# Patient Record
Sex: Male | Born: 1975 | Race: White | Hispanic: No | Marital: Married | Smoking: Current every day smoker
Health system: Southern US, Community
[De-identification: ages and names within clinical notes are randomized; demographics above are authoritative.]

## PROBLEM LIST (undated history)

## (undated) DIAGNOSIS — F32A Depression, unspecified: Secondary | ICD-10-CM

## (undated) DIAGNOSIS — F329 Major depressive disorder, single episode, unspecified: Secondary | ICD-10-CM

---

## 2003-10-29 ENCOUNTER — Inpatient Hospital Stay (HOSPITAL_COMMUNITY): Admission: AD | Admit: 2003-10-29 | Discharge: 2003-10-30 | Payer: Self-pay | Admitting: Psychiatry

## 2009-06-19 ENCOUNTER — Emergency Department (HOSPITAL_COMMUNITY): Admission: EM | Admit: 2009-06-19 | Discharge: 2009-06-19 | Payer: Self-pay | Admitting: Emergency Medicine

## 2010-04-06 ENCOUNTER — Emergency Department (HOSPITAL_COMMUNITY): Admission: EM | Admit: 2010-04-06 | Discharge: 2010-04-06 | Payer: Self-pay | Admitting: Emergency Medicine

## 2010-06-11 ENCOUNTER — Emergency Department (HOSPITAL_COMMUNITY)
Admission: EM | Admit: 2010-06-11 | Discharge: 2010-06-12 | Payer: Self-pay | Source: Home / Self Care | Admitting: Emergency Medicine

## 2010-09-13 LAB — DIFFERENTIAL
Basophils Absolute: 0.1 K/uL (ref 0.0–0.1)
Basophils Relative: 0 % (ref 0–1)
Eosinophils Absolute: 0.2 K/uL (ref 0.0–0.7)
Eosinophils Relative: 1 % (ref 0–5)
Lymphocytes Relative: 30 % (ref 12–46)
Lymphs Abs: 3.8 10*3/uL (ref 0.7–4.0)
Monocytes Absolute: 0.9 10*3/uL (ref 0.1–1.0)
Monocytes Relative: 8 % (ref 3–12)
Neutro Abs: 7.5 K/uL (ref 1.7–7.7)
Neutrophils Relative %: 60 % (ref 43–77)

## 2010-09-13 LAB — CBC
HCT: 48.6 % (ref 39.0–52.0)
Hemoglobin: 17.2 g/dL — ABNORMAL HIGH (ref 13.0–17.0)
MCH: 33.6 pg (ref 26.0–34.0)
MCHC: 35.4 g/dL (ref 30.0–36.0)
MCV: 94.9 fL (ref 78.0–100.0)
Platelets: 232 10*3/uL (ref 150–400)
RBC: 5.12 MIL/uL (ref 4.22–5.81)
RDW: 12.5 % (ref 11.5–15.5)
WBC: 12.5 10*3/uL — ABNORMAL HIGH (ref 4.0–10.5)

## 2010-09-13 LAB — BASIC METABOLIC PANEL WITH GFR
BUN: 6 mg/dL (ref 6–23)
CO2: 24 meq/L (ref 19–32)
Calcium: 9.4 mg/dL (ref 8.4–10.5)
Creatinine, Ser: 0.98 mg/dL (ref 0.4–1.5)
GFR calc non Af Amer: >60 mL/min (ref >60)
Glucose, Bld: 90 mg/dL (ref 70–99)

## 2010-09-13 LAB — RAPID URINE DRUG SCREEN, HOSP PERFORMED
Amphetamines: NOT DETECTED
Barbiturates: NOT DETECTED
Benzodiazepines: NOT DETECTED
Cocaine: NOT DETECTED
Opiates: NOT DETECTED
Tetrahydrocannabinol: POSITIVE — AB

## 2010-09-13 LAB — BASIC METABOLIC PANEL
Chloride: 104 mEq/L (ref 96–112)
GFR calc Af Amer: >60 mL/min (ref >60)
Potassium: 4.1 mEq/L (ref 3.5–5.1)
Sodium: 141 mEq/L (ref 135–145)

## 2010-09-13 LAB — ETHANOL: Alcohol, Ethyl (B): 129 mg/dL — ABNORMAL HIGH (ref 0–10)

## 2010-11-18 NOTE — Discharge Summary (Signed)
[name], [name] NO.:  [id number]   MEDICAL RECORD NO.:  [id number]                   PATIENT TYPE:  IPS   LOCATION:  0506                                 FACILITY[facility]  [facility]   PHYSICIAN:  Geoffery Lyons, M.D.                   DATE OF BIRTH:  11-26-1975   DATE OF ADMISSION:  10/29/2003  DATE OF DISCHARGE:  10/30/2003                                 DISCHARGE SUMMARY   CHIEF COMPLAINT AND PRESENT ILLNESS:  This was the first admission to [facility] for this 35 year old single white male  involuntarily committed.  He had history of intentional overdose.  He  overdosed on a [employer] sleeping aid, taking 24 pills.  He wrote a note to  his mother.  Intention was to sleep and not wake up.  He called his friends  and family to say goodbye.  He was transported to the emergency department.  He had an attempt to leave the emergency room against medical advice.  He  reported that he was experiencing stressors that he would rather not talk  about.   PAST PSYCHIATRIC HISTORY:  This was the first time at [facility].  No other psychiatric treatment.   SUBSTANCE ABUSE HISTORY:  He drank six to seven beers the day of the  overdose but denied any alcohol problems.   PAST MEDICAL HISTORY:  Noncontributory.   MEDICATIONS:  None.   PHYSICAL EXAMINATION:  Physical examination was performed, failed to show  any acute findings.  Blood pressure 136/33.   LABORATORY DATA:  Alcohol level was 157.  Potassium 3.4  Urine drug screen  was negative.  CBC: White blood cells 11.9.   MENTAL STATUS EXAM:  Mental status exam revealed a cooperative male, in bed,  sleepy, fair eye contact.  Speech was clear.  Sleepy, trying to cooperate.  Thought processes: Answers were coherent.  He did not appear to be  psychotic.  Cognitive: Cognition was well preserved.   ADMISSION DIAGNOSES:   AXIS I:  1. Major depression.  2. Alcohol abuse.   AXIS  II:  No diagnosis.   AXIS III:  No diagnosis.   AXIS IV:  Moderate.   AXIS V:  Global assessment of functioning upon admission 25, highest global  assessment of functioning in the last year 65-70.   HOSPITAL COURSE:  He was admitted and started in intensive individual and  group psychotherapy.  He was given Ambien for sleep, Librium on an as needed  basis.  He was started on Lexapro 5 mg.  By April 29, he was endorsing no  suicidal ideas.  He said that what he did was stupid, that he would not do  it again.  He was agreeable to taking antidepressants.  He said that he did  not need to be there.  He wanted to be discharged.  He was in  full contact  with reality.  He endorsed no suicidal ideas, no homicidal ideas, no  hallucinations, no delusions.  He endorsed that he was under increased  stress pressure from the girlfriend when she had decided to leave, he lost  his job, had financial difficulties.  He would be willing to go to  outpatient treatment.  He did not have a license due to his second DWI.  He  endorsed support from the mother.  He said that he had lined up a possible  job, would check with a Clinical research associate for bankruptcy.  He planned to get his life  back together.  We contacted the mother who was agreeable with him being  discharged.  She had no concerns so we went ahead and discharged to  outpatient followup.   DISCHARGE DIAGNOSES:   AXIS I:  1. Alcohol abuse.  2. Major depression.   AXIS II:  No diagnosis.   AXIS III:  No diagnosis.   AXIS IV:  Moderate.   AXIS V:  Global assessment of functioning upon discharge 50.   DISCHARGE MEDICATIONS:  Lexapro 10 mg per day.   FOLLOW UP:  He was to follow up at [facility].                                               Geoffery Lyons, M.D.    IL/MEDQ  D:  11/25/2003  T:  11/27/2003  Job:  [id number]

## 2010-11-18 NOTE — H&P (Signed)
[name], GUYMON NO.:  [id number]   MEDICAL RECORD NO.:  [id number]                   PATIENT TYPE:  IPS   LOCATION:  0506                                 FACILITY:  [facility]   PHYSICIAN:  Geoffery Lyons, M.D.                   DATE OF BIRTH:  [birth year]   DATE OF ADMISSION:  10/29/2003  DATE OF DISCHARGE:  10/30/2003                         PSYCHIATRIC ADMISSION ASSESSMENT   IDENTIFYING INFORMATION:  [name] was  involuntarily committed on October 29, 2003.   HISTORY OF PRESENT ILLNESS:  The patient presents with a history of  intentional overdose.  The patient overdosed on [employer] sleeping aid,  taking approximately 24 pills.  The patient states he wrote a note to his  mother.  He states his intention with the overdose was to sleep and not wake  up.  The patient states he called his friends and family to say goodbye.  The patient was transported to the emergency department.  The patient also  had attempted to leave the ER against medical advice.  The patient reports  that he is experiencing stressors that he would rather not talk about.   PAST PSYCHIATRIC HISTORY:  First hospitalization to [facility].  No other psychiatric admissions.  No apparent history of being  detoxed before from alcohol.   SOCIAL HISTORY:  This is a 35 year old single white male.  He lives with his  mom.  He was recently fired from his job on Friday because it was a  deliberate work slowdown.  The patient states that he wanted a raise, which  he did not get.  Denies any legal problems.   FAMILY HISTORY:  Unclear.   ALCOHOL/DRUG HISTORY:  The patient states he drank 6-7 beers on the day of  his overdose.  Denies any alcohol problem.  No apparent drug use.   PRIMARY CARE PHYSICIAN:  None.   MEDICAL PROBLEMS:  None.   MEDICATIONS:  None.   ALLERGIES:  No known allergies.   PHYSICAL EXAMINATION:  The patient was assessed at the  [facility].  The patient was charcoaled.   LABORATORY DATA:  His alcohol level on admission was 157 down to 107 prior  to transfer.  Potassium is 3.4.  Urine drug screen was negative.  WBC count  was 11.9.  His blood pressure was 136/83  She appears in no acute distress.   MENTAL STATUS EXAM:  He is in bed, sleepy, cooperative.  Fair eye contact.  Speech is clear.  The patient feels sleepy.  He appears as such.  He is  trying to be cooperative as possible.  Thought process answers are coherent.  He does not appear to be psychotic at this time.  Cognitive function intact.  Memory is fair.  Judgment is fair.  Insight is somewhat limited.   DIAGNOSES:   AXIS I:  1. Major depressive  disorder with intentional overdose, single episode.  2. Rule out alcohol abuse.   AXIS II:  Deferred.   AXIS III:  1. Problems with occupation.  2. Other psychosocial problems.   AXIS IV:  Current 25; past year estimated at 65.   PLAN:  Involuntary commitment for intentional overdose.  Contract for  safety.  Stabilize mood and thinking.  The patient is to increase his coping  skills.  Will have Librium available for withdrawal symptoms.  Initiate an  antidepressant.  Will have a family session with support group.     [name], N.P.                       Geoffery Lyons, M.D.    JO/MEDQ  D:  10/30/2003  T:  11/01/2003  Job:  [id number]

## 2012-04-08 ENCOUNTER — Emergency Department (HOSPITAL_COMMUNITY)
Admission: EM | Admit: 2012-04-08 | Discharge: 2012-04-10 | Disposition: A | Discharge status: Home / Self Care | Payer: Self-pay | Attending: Emergency Medicine | Admitting: Emergency Medicine

## 2012-04-08 DIAGNOSIS — Z046 Encounter for general psychiatric examination, requested by authority: Secondary | ICD-10-CM | POA: Insufficient documentation

## 2012-04-08 DIAGNOSIS — T50904A Poisoning by unspecified drugs, medicaments and biological substances, undetermined, initial encounter: Secondary | ICD-10-CM | POA: Insufficient documentation

## 2012-04-08 DIAGNOSIS — T50901A Poisoning by unspecified drugs, medicaments and biological substances, accidental (unintentional), initial encounter: Secondary | ICD-10-CM | POA: Insufficient documentation

## 2012-04-08 HISTORY — DX: Major depressive disorder, single episode, unspecified: F32.9

## 2012-04-08 HISTORY — DX: Depression, unspecified: F32.A

## 2012-04-08 NOTE — ED Notes (Signed)
MVH:QION<GE> Expected date:04/08/12<BR> Expected time:11:43 PM<BR> Means of arrival:Ambulance<BR> Comments:<BR> ETOH, OTC sleeping medication

## 2012-04-08 NOTE — ED Notes (Signed)
[employer] taking out IVC papers on pt at this time

## 2012-04-08 NOTE — ED Notes (Signed)
Per EMS pt has been drinking beer tonight in large amts and has taken some OTC sleeping pills  Pt's family called EMS after he stated he had taken the pills  Pt states he took the pills about 2 hrs ago  No pill boxes or bottles found at the residence

## 2012-04-09 LAB — CBC
HCT: 46.1 % (ref 39.0–52.0)
MCV: 94.3 fL (ref 78.0–100.0)
RDW: 12.6 % (ref 11.5–15.5)
WBC: 11.4 10*3/uL — ABNORMAL HIGH (ref 4.0–10.5)

## 2012-04-09 LAB — COMPREHENSIVE METABOLIC PANEL
Albumin: 4.4 g/dL (ref 3.5–5.2)
BUN: 8 mg/dL (ref 6–23)
CO2: 22 mEq/L (ref 19–32)
Chloride: 96 mEq/L (ref 96–112)
Creatinine, Ser: 0.9 mg/dL (ref 0.50–1.35)
GFR calc non Af Amer: >90 mL/min (ref >90)
Total Bilirubin: 0.6 mg/dL (ref 0.3–1.2)

## 2012-04-09 LAB — ETHANOL: Alcohol, Ethyl (B): 231 mg/dL — ABNORMAL HIGH (ref 0–11)

## 2012-04-09 LAB — RAPID URINE DRUG SCREEN, HOSP PERFORMED: Barbiturates: NOT DETECTED

## 2012-04-09 LAB — ACETAMINOPHEN LEVEL: Acetaminophen (Tylenol), Serum: <15 ug/mL (ref 10–30)

## 2012-04-09 MED ORDER — IBUPROFEN 200 MG PO TABS: 600.0000 mg | ORAL_TABLET | Freq: Three times a day (TID) | ORAL | Status: DC | PRN

## 2012-04-09 MED ORDER — ONDANSETRON HCL 4 MG PO TABS: 4.0000 mg | ORAL_TABLET | Freq: Three times a day (TID) | ORAL | Status: DC | PRN

## 2012-04-09 MED ORDER — ALUM & MAG HYDROXIDE-SIMETH 200-200-20 MG/5ML PO SUSP: 30.0000 mL | ORAL | Status: DC | PRN

## 2012-04-09 MED ORDER — NICOTINE 21 MG/24HR TD PT24
21.0000 mg | MEDICATED_PATCH | Freq: Every day | TRANSDERMAL | Status: DC
  Filled 2012-04-09: qty 1

## 2012-04-09 MED ORDER — ACETAMINOPHEN 325 MG PO TABS: 650.0000 mg | ORAL_TABLET | ORAL | Status: DC | PRN

## 2012-04-09 MED ORDER — LORATADINE 10 MG PO TABS
10.0000 mg | ORAL_TABLET | Freq: Every day | ORAL | Status: DC
  Administered 2012-04-09: 10 mg via ORAL
  Filled 2012-04-09 (×2): qty 1

## 2012-04-09 NOTE — ED Notes (Signed)
Pt is in handcuff by the [employer] because pt attempted to leave the facility. GDP sts they have to stay with this patient until pt transfer to [facility] ED. Pt only has his right hand handcuff, CMS intact, neurologically intact, no skin breakdown noted.

## 2012-04-09 NOTE — ED Notes (Signed)
Sister: Rinaldo Cloud [phone]

## 2012-04-09 NOTE — ED Notes (Signed)
Report given to [facility] Nurse Toniann Fail. Will transport pt to psy ED

## 2012-04-09 NOTE — ED Notes (Signed)
[id number]<HK> Expected date:<BR> Expected time:<BR> Means of arrival:<BR> Comments:<BR> Hold for room 20

## 2012-04-09 NOTE — ED Notes (Signed)
Pt slamming his arms into side rails and cursing ED staff. Pt asked to stop this behavior. Pt sts "I just want out of here." Pt informed his behavior is not the way to get out of the ED faster.

## 2012-04-09 NOTE — ED Notes (Signed)
Pt leaving out of EMS bay doors- stating that I don't need to be here. Security called to get patient.

## 2012-04-09 NOTE — ED Notes (Signed)
Pt admitted to psych ED. S/P overdose on OTC sleeping pills. VSS. Denies SI/HI presently. Denies sx of psychosis and no evidence. Affect flat. Mood sad. States drinks ETOH daily 12-pack beer. No withdrawal sx noted. Denies other substance abuse except occasional THC.

## 2012-04-09 NOTE — ED Notes (Signed)
Pt returned to [facility] A by [employer] with IVC paperwork

## 2012-04-09 NOTE — ED Notes (Signed)
1 bag of belonging bag placed in TCU locker 36
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
## 2012-04-09 NOTE — ED Notes (Signed)
wanded by security 

## 2012-04-09 NOTE — ED Notes (Signed)
Pt sts he has drank 1 case of beer tonight along with 20 sleeping pills. sts he was not trying to harm himself, that he was trying to sleep peacefully. sts "I'm dying next year anyway from liver disease! What do you care?"

## 2012-04-09 NOTE — ED Notes (Signed)
Security called, pt taken off hand cuff. Pt placed in blue scrubs. Foley and IV removed

## 2012-04-09 NOTE — ED Notes (Signed)
Pt is medically clear to go to [facility] ED.

## 2012-04-10 ENCOUNTER — Encounter (HOSPITAL_COMMUNITY): Payer: Self-pay | Admitting: *Deleted

## 2012-04-10 NOTE — ED Notes (Signed)
Tele pysch in progress 

## 2012-04-10 NOTE — ED Notes (Signed)
Patient discharge with steady gait. Respirations equal and unlabored. Skin warm and dry. No acute distress noted. 
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
## 2012-04-10 NOTE — ED Provider Notes (Signed)
Telepsych saw patient, appreciate recs. Patient cleared by psych. Not suicidal or homicidal currently. Recommend outpatient follow up.   Richardean Canal, MD 04/10/12 [phone]

## 2012-04-11 LAB — GLUCOSE, CAPILLARY: Glucose-Capillary: 93 mg/dL (ref 70–99)

## 2015-07-21 ENCOUNTER — Emergency Department (HOSPITAL_COMMUNITY)
Admission: EM | Admit: 2015-07-21 | Discharge: 2015-07-22 | Disposition: A | Discharge status: Home / Self Care | Payer: BLUE CROSS/BLUE SHIELD | Attending: Emergency Medicine | Admitting: Emergency Medicine

## 2015-07-21 ENCOUNTER — Emergency Department (HOSPITAL_COMMUNITY): Payer: BLUE CROSS/BLUE SHIELD

## 2015-07-21 DIAGNOSIS — Y9389 Activity, other specified: Secondary | ICD-10-CM | POA: Diagnosis not present

## 2015-07-21 DIAGNOSIS — Y92481 Parking lot as the place of occurrence of the external cause: Secondary | ICD-10-CM | POA: Insufficient documentation

## 2015-07-21 DIAGNOSIS — R000 Tachycardia, unspecified: Secondary | ICD-10-CM | POA: Diagnosis not present

## 2015-07-21 DIAGNOSIS — W01198A Fall on same level from slipping, tripping and stumbling with subsequent striking against other object, initial encounter: Secondary | ICD-10-CM | POA: Diagnosis not present

## 2015-07-21 DIAGNOSIS — R4182 Altered mental status, unspecified: Secondary | ICD-10-CM

## 2015-07-21 DIAGNOSIS — F1020 Alcohol dependence, uncomplicated: Secondary | ICD-10-CM | POA: Diagnosis not present

## 2015-07-21 DIAGNOSIS — Y998 Other external cause status: Secondary | ICD-10-CM | POA: Diagnosis not present

## 2015-07-21 DIAGNOSIS — S0081XA Abrasion of other part of head, initial encounter: Secondary | ICD-10-CM | POA: Insufficient documentation

## 2015-07-21 DIAGNOSIS — R451 Restlessness and agitation: Secondary | ICD-10-CM | POA: Diagnosis not present

## 2015-07-21 DIAGNOSIS — F1910 Other psychoactive substance abuse, uncomplicated: Secondary | ICD-10-CM | POA: Diagnosis not present

## 2015-07-21 DIAGNOSIS — F10921 Alcohol use, unspecified with intoxication delirium: Secondary | ICD-10-CM

## 2015-07-21 DIAGNOSIS — Z79899 Other long term (current) drug therapy: Secondary | ICD-10-CM | POA: Diagnosis not present

## 2015-07-21 DIAGNOSIS — F10121 Alcohol abuse with intoxication delirium: Secondary | ICD-10-CM | POA: Insufficient documentation

## 2015-07-21 DIAGNOSIS — F332 Major depressive disorder, recurrent severe without psychotic features: Secondary | ICD-10-CM | POA: Diagnosis not present

## 2015-07-21 DIAGNOSIS — Z046 Encounter for general psychiatric examination, requested by authority: Secondary | ICD-10-CM | POA: Diagnosis present

## 2015-07-21 LAB — CBC WITH DIFFERENTIAL/PLATELET
BASOS ABS: 0.1 10*3/uL (ref 0.0–0.1)
Basophils Relative: 1 %
Eosinophils Absolute: 0.2 10*3/uL (ref 0.0–0.7)
Eosinophils Relative: 2 %
HEMATOCRIT: 44.5 % (ref 39.0–52.0)
Hemoglobin: 15.5 g/dL (ref 13.0–17.0)
LYMPHS ABS: 3.4 10*3/uL (ref 0.7–4.0)
Lymphocytes Relative: 38 %
MCH: 32.7 pg (ref 26.0–34.0)
MCHC: 34.8 g/dL (ref 30.0–36.0)
MCV: 93.9 fL (ref 78.0–100.0)
MONO ABS: 0.7 10*3/uL (ref 0.1–1.0)
MONOS PCT: 8 %
Neutro Abs: 4.5 10*3/uL (ref 1.7–7.7)
Neutrophils Relative %: 51 %
PLATELETS: 230 10*3/uL (ref 150–400)
RBC: 4.74 MIL/uL (ref 4.22–5.81)
RDW: 11.8 % (ref 11.5–15.5)
WBC: 8.9 10*3/uL (ref 4.0–10.5)

## 2015-07-21 LAB — I-STAT CG4 LACTIC ACID, ED
LACTIC ACID, VENOUS: 4.55 mmol/L — AB (ref 0.5–2.0)
Lactic Acid, Venous: 2.04 mmol/L (ref 0.5–2.0)

## 2015-07-21 LAB — COMPREHENSIVE METABOLIC PANEL
ALT: 59 U/L (ref 17–63)
AST: 43 U/L — ABNORMAL HIGH (ref 15–41)
Albumin: 4.4 g/dL (ref 3.5–5.0)
Alkaline Phosphatase: 88 U/L (ref 38–126)
Anion gap: 15 (ref 5–15)
BUN: 13 mg/dL (ref 6–20)
CHLORIDE: 102 mmol/L (ref 101–111)
CO2: 19 mmol/L — ABNORMAL LOW (ref 22–32)
CREATININE: 1 mg/dL (ref 0.61–1.24)
Calcium: 8.9 mg/dL (ref 8.9–10.3)
Glucose, Bld: 97 mg/dL (ref 65–99)
Potassium: 3.8 mmol/L (ref 3.5–5.1)
Sodium: 136 mmol/L (ref 135–145)
Total Bilirubin: 0.4 mg/dL (ref 0.3–1.2)
Total Protein: 7.2 g/dL (ref 6.5–8.1)

## 2015-07-21 LAB — ETHANOL: ALCOHOL ETHYL (B): 223 mg/dL — AB (ref <5)

## 2015-07-21 LAB — SALICYLATE LEVEL

## 2015-07-21 LAB — I-STAT CHEM 8, ED
BUN: 13 mg/dL (ref 6–20)
CALCIUM ION: 1.03 mmol/L — AB (ref 1.12–1.23)
CREATININE: 1.1 mg/dL (ref 0.61–1.24)
Chloride: 102 mmol/L (ref 101–111)
GLUCOSE: 94 mg/dL (ref 65–99)
HCT: 49 % (ref 39.0–52.0)
HEMOGLOBIN: 16.7 g/dL (ref 13.0–17.0)
Potassium: 3.7 mmol/L (ref 3.5–5.1)
Sodium: 136 mmol/L (ref 135–145)
TCO2: 18 mmol/L (ref 0–100)

## 2015-07-21 LAB — CBG MONITORING, ED: Glucose-Capillary: 97 mg/dL (ref 65–99)

## 2015-07-21 LAB — TROPONIN I

## 2015-07-21 LAB — ACETAMINOPHEN LEVEL

## 2015-07-21 MED ORDER — SODIUM CHLORIDE 0.9 % IV BOLUS (SEPSIS)
1000.0000 mL | Freq: Once | INTRAVENOUS | Status: DC
Start: 2015-07-21 — End: 2015-07-22

## 2015-07-21 MED ORDER — ZIPRASIDONE MESYLATE 20 MG IM SOLR
20.0000 mg | Freq: Once | INTRAMUSCULAR | Status: AC
  Administered 2015-07-21: 20 mg via INTRAMUSCULAR

## 2015-07-21 MED ORDER — SODIUM CHLORIDE 0.9 % IV BOLUS (SEPSIS)
1000.0000 mL | Freq: Once | INTRAVENOUS | Status: AC
  Administered 2015-07-21: 1000 mL via INTRAVENOUS

## 2015-07-21 MED ORDER — STERILE WATER FOR INJECTION IJ SOLN
INTRAMUSCULAR | Status: AC
  Administered 2015-07-21: 10 mL
  Filled 2015-07-21: qty 10

## 2015-07-21 MED ORDER — ZIPRASIDONE MESYLATE 20 MG IM SOLR
INTRAMUSCULAR | Status: AC
  Filled 2015-07-21: qty 20

## 2015-07-21 MED ORDER — SODIUM CHLORIDE 0.9 % IV BOLUS (SEPSIS)
500.0000 mL | Freq: Once | INTRAVENOUS | Status: AC
  Administered 2015-07-21: 500 mL via INTRAVENOUS

## 2015-07-21 NOTE — Progress Notes (Signed)
EDCM went to speak to patient at bedside, however, patient was asleep. Patient listed as not having a pcp or insurance living in [facility].  [facility] provided patient with contact information to [facility], informed patient of services there.  EDCM also provided patient with list of pcps who accept self pay patients, list of discount pharmacies and websites needymeds.org and GoodRX.com for medication assistance, phone number to inquire about the orange card, phone number to inquire about Medicaid, phone number to inquire about the Affordable Care Act, financial resources in the community such as local churches, salvation [employer], urban ministries, and dental assistance for uninsured patients.  . This information was placed on bedside table as patient was sleeping.  EDCM did not wake patient at this time.   No further EDCM needs at this time.
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
## 2015-07-21 NOTE — ED Notes (Signed)
Communication with EDP to d/c hard restraints. Pt is resting calmly

## 2015-07-21 NOTE — ED Notes (Signed)
Unable to collect labs at this time because of behavior.

## 2015-07-21 NOTE — ED Notes (Signed)
Abnormal lab result given to Dr Rubin Payor
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
## 2015-07-21 NOTE — ED Notes (Signed)
Pt has urinated on himself. Clothing is wet.

## 2015-07-22 ENCOUNTER — Encounter (HOSPITAL_COMMUNITY): Payer: Self-pay | Admitting: *Deleted

## 2015-07-22 ENCOUNTER — Emergency Department (EMERGENCY_DEPARTMENT_HOSPITAL)
Admission: EM | Admit: 2015-07-22 | Discharge: 2015-07-23 | Disposition: A | Discharge status: Other Acute Inpatient Hospital | Payer: BLUE CROSS/BLUE SHIELD | Source: Home / Self Care | Attending: Emergency Medicine | Admitting: Emergency Medicine

## 2015-07-22 ENCOUNTER — Emergency Department (HOSPITAL_COMMUNITY): Payer: BLUE CROSS/BLUE SHIELD

## 2015-07-22 DIAGNOSIS — R000 Tachycardia, unspecified: Secondary | ICD-10-CM | POA: Insufficient documentation

## 2015-07-22 DIAGNOSIS — F10129 Alcohol abuse with intoxication, unspecified: Secondary | ICD-10-CM | POA: Insufficient documentation

## 2015-07-22 DIAGNOSIS — S0003XA Contusion of scalp, initial encounter: Secondary | ICD-10-CM

## 2015-07-22 DIAGNOSIS — Y9289 Other specified places as the place of occurrence of the external cause: Secondary | ICD-10-CM

## 2015-07-22 DIAGNOSIS — F1020 Alcohol dependence, uncomplicated: Secondary | ICD-10-CM | POA: Diagnosis present

## 2015-07-22 DIAGNOSIS — Y998 Other external cause status: Secondary | ICD-10-CM | POA: Insufficient documentation

## 2015-07-22 DIAGNOSIS — R4182 Altered mental status, unspecified: Secondary | ICD-10-CM

## 2015-07-22 DIAGNOSIS — F1910 Other psychoactive substance abuse, uncomplicated: Secondary | ICD-10-CM

## 2015-07-22 DIAGNOSIS — W01198A Fall on same level from slipping, tripping and stumbling with subsequent striking against other object, initial encounter: Secondary | ICD-10-CM

## 2015-07-22 DIAGNOSIS — F329 Major depressive disorder, single episode, unspecified: Secondary | ICD-10-CM

## 2015-07-22 DIAGNOSIS — F332 Major depressive disorder, recurrent severe without psychotic features: Secondary | ICD-10-CM | POA: Diagnosis present

## 2015-07-22 DIAGNOSIS — F1310 Sedative, hypnotic or anxiolytic abuse, uncomplicated: Secondary | ICD-10-CM | POA: Insufficient documentation

## 2015-07-22 DIAGNOSIS — Y9389 Activity, other specified: Secondary | ICD-10-CM

## 2015-07-22 DIAGNOSIS — S0992XA Unspecified injury of nose, initial encounter: Secondary | ICD-10-CM

## 2015-07-22 DIAGNOSIS — R45851 Suicidal ideations: Secondary | ICD-10-CM

## 2015-07-22 LAB — COMPREHENSIVE METABOLIC PANEL
ALBUMIN: 4.5 g/dL (ref 3.5–5.0)
ALT: 60 U/L (ref 17–63)
AST: 45 U/L — AB (ref 15–41)
Alkaline Phosphatase: 94 U/L (ref 38–126)
Anion gap: 12 (ref 5–15)
BILIRUBIN TOTAL: 0.9 mg/dL (ref 0.3–1.2)
BUN: 8 mg/dL (ref 6–20)
CHLORIDE: 101 mmol/L (ref 101–111)
CO2: 19 mmol/L — ABNORMAL LOW (ref 22–32)
CREATININE: 0.76 mg/dL (ref 0.61–1.24)
Calcium: 8.7 mg/dL — ABNORMAL LOW (ref 8.9–10.3)
GFR calc Af Amer: >60 mL/min (ref >60)
GLUCOSE: 89 mg/dL (ref 65–99)
Potassium: 3.9 mmol/L (ref 3.5–5.1)
Sodium: 132 mmol/L — ABNORMAL LOW (ref 135–145)
TOTAL PROTEIN: 7.2 g/dL (ref 6.5–8.1)

## 2015-07-22 LAB — URINALYSIS, ROUTINE W REFLEX MICROSCOPIC
BILIRUBIN URINE: NEGATIVE
GLUCOSE, UA: NEGATIVE mg/dL
HGB URINE DIPSTICK: NEGATIVE
KETONES UR: NEGATIVE mg/dL
Leukocytes, UA: NEGATIVE
Nitrite: NEGATIVE
PROTEIN: NEGATIVE mg/dL
Specific Gravity, Urine: 1.004 — ABNORMAL LOW (ref 1.005–1.030)
pH: 5 (ref 5.0–8.0)

## 2015-07-22 LAB — RAPID URINE DRUG SCREEN, HOSP PERFORMED
Amphetamines: NOT DETECTED
BARBITURATES: NOT DETECTED
BENZODIAZEPINES: NOT DETECTED
COCAINE: NOT DETECTED
Opiates: NOT DETECTED
Tetrahydrocannabinol: NOT DETECTED

## 2015-07-22 LAB — SALICYLATE LEVEL: Salicylate Lvl: <4 mg/dL (ref 2.8–30.0)

## 2015-07-22 LAB — CBC
HEMATOCRIT: 43.9 % (ref 39.0–52.0)
Hemoglobin: 15.6 g/dL (ref 13.0–17.0)
MCH: 33.5 pg (ref 26.0–34.0)
MCHC: 35.5 g/dL (ref 30.0–36.0)
MCV: 94.4 fL (ref 78.0–100.0)
PLATELETS: 243 10*3/uL (ref 150–400)
RBC: 4.65 MIL/uL (ref 4.22–5.81)
RDW: 12 % (ref 11.5–15.5)
WBC: 10.9 10*3/uL — AB (ref 4.0–10.5)

## 2015-07-22 LAB — ETHANOL: ALCOHOL ETHYL (B): 234 mg/dL — AB (ref <5)

## 2015-07-22 LAB — ACETAMINOPHEN LEVEL: Acetaminophen (Tylenol), Serum: <10 ug/mL — ABNORMAL LOW (ref 10–30)

## 2015-07-22 MED ORDER — ONDANSETRON HCL 4 MG PO TABS: 4.0000 mg | ORAL_TABLET | Freq: Three times a day (TID) | ORAL | Status: DC | PRN

## 2015-07-22 MED ORDER — ALUM & MAG HYDROXIDE-SIMETH 200-200-20 MG/5ML PO SUSP: 30.0000 mL | ORAL | Status: DC | PRN

## 2015-07-22 MED ORDER — LORAZEPAM 1 MG PO TABS: 0.0000 mg | ORAL_TABLET | Freq: Four times a day (QID) | ORAL | Status: DC

## 2015-07-22 MED ORDER — LORAZEPAM 1 MG PO TABS: 0.0000 mg | ORAL_TABLET | Freq: Two times a day (BID) | ORAL | Status: DC

## 2015-07-22 MED ORDER — ACETAMINOPHEN 325 MG PO TABS: 650.0000 mg | ORAL_TABLET | ORAL | Status: DC | PRN

## 2015-07-22 MED ORDER — NICOTINE 21 MG/24HR TD PT24
21.0000 mg | MEDICATED_PATCH | Freq: Every day | TRANSDERMAL | Status: DC
Start: 2015-07-22 — End: 2015-07-23
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
## 2015-07-22 NOTE — Discharge Instructions (Signed)
Alcohol Intoxication  Alcohol intoxication occurs when the amount of alcohol that a person has consumed impairs his or her ability to mentally and physically function. Alcohol directly impairs the normal chemical activity of the brain. Drinking large amounts of alcohol can lead to changes in mental function and behavior, and it can cause many physical effects that can be harmful.   Alcohol intoxication can range in severity from mild to very severe. Various factors can affect the level of intoxication that occurs, such as the person's age, gender, weight, frequency of alcohol consumption, and the presence of other medical conditions (such as diabetes, seizures, or heart conditions). Dangerous levels of alcohol intoxication may occur when people drink large amounts of alcohol in a short period (binge drinking). Alcohol can also be especially dangerous when combined with certain prescription medicines or "recreational" drugs.  SIGNS AND SYMPTOMS  Some common signs and symptoms of mild alcohol intoxication include:  · Loss of coordination.  · Changes in mood and behavior.  · Impaired judgment.  · Slurred speech.  As alcohol intoxication progresses to more severe levels, other signs and symptoms will appear. These may include:  · Vomiting.  · Confusion and impaired memory.  · Slowed breathing.  · Seizures.  · Loss of consciousness.  DIAGNOSIS   Your health care provider will take a medical history and perform a physical exam. You will be asked about the amount and type of alcohol you have consumed. Blood tests will be done to measure the concentration of alcohol in your blood. In many places, your blood alcohol level must be lower than 80 mg/dL (0.08%) to legally drive. However, many dangerous effects of alcohol can occur at much lower levels.   TREATMENT   People with alcohol intoxication often do not require treatment. Most of the effects of alcohol intoxication are temporary, and they go away as the alcohol naturally  leaves the body. Your health care provider will monitor your condition until you are stable enough to go home. Fluids are sometimes given through an IV access tube to help prevent dehydration.   HOME CARE INSTRUCTIONS  · Do not drive after drinking alcohol.  · Stay hydrated. Drink enough water and fluids to keep your urine clear or pale yellow. Avoid caffeine.    · Only take over-the-counter or prescription medicines as directed by your health care provider.    SEEK MEDICAL CARE IF:   · You have persistent vomiting.    · You do not feel better after a few days.  · You have frequent alcohol intoxication. Your health care provider can help determine if you should see a substance use treatment counselor.  SEEK IMMEDIATE MEDICAL CARE IF:   · You become shaky or tremble when you try to stop drinking.    · You shake uncontrollably (seizure).    · You throw up (vomit) blood. This may be bright red or may look like black coffee grounds.    · You have blood in your stool. This may be bright red or may appear as a black, tarry, bad smelling stool.    · You become lightheaded or faint.    MAKE SURE YOU:   · Understand these instructions.  · Will watch your condition.  · Will get help right away if you are not doing well or get worse.     This information is not intended to replace advice given to you by your health care provider. Make sure you discuss any questions you have with your health care provider.       Document Released: 03/29/2005 Document Revised: 02/19/2013 Document Reviewed: 11/22/2012  Elsevier Interactive Patient Education ©2016 [employer]

## 2015-07-22 NOTE — BH Assessment (Signed)
Assessment completed. Consulted Hulan Fess, NP who recommended that pt be evaluated by psychiatry in the morning. Informed Dr. Hyacinth Meeker of the recommendation.

## 2015-07-22 NOTE — BH Assessment (Signed)
Attempted to gather additional information due to petitioner being unavailable at this time. Will attempt contact at a later time.
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
## 2015-07-22 NOTE — ED Notes (Signed)
Pt ambulated to station asking for bathroom. Asking what happened, im ready to go home. Refuses to change pants.
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
## 2015-07-23 ENCOUNTER — Inpatient Hospital Stay (HOSPITAL_COMMUNITY)
Admission: AD | Admit: 2015-07-23 | Discharge: 2015-07-27 | DRG: 897 | Disposition: A | Discharge status: Home / Self Care | Payer: BLUE CROSS/BLUE SHIELD | Source: Intra-hospital | Attending: Psychiatry | Admitting: Psychiatry

## 2015-07-23 ENCOUNTER — Encounter (HOSPITAL_COMMUNITY): Payer: Self-pay | Admitting: *Deleted

## 2015-07-23 DIAGNOSIS — F17210 Nicotine dependence, cigarettes, uncomplicated: Secondary | ICD-10-CM | POA: Diagnosis present

## 2015-07-23 DIAGNOSIS — F332 Major depressive disorder, recurrent severe without psychotic features: Secondary | ICD-10-CM | POA: Diagnosis not present

## 2015-07-23 DIAGNOSIS — G4700 Insomnia, unspecified: Secondary | ICD-10-CM | POA: Diagnosis present

## 2015-07-23 DIAGNOSIS — F1024 Alcohol dependence with alcohol-induced mood disorder: Secondary | ICD-10-CM | POA: Diagnosis present

## 2015-07-23 DIAGNOSIS — F1910 Other psychoactive substance abuse, uncomplicated: Secondary | ICD-10-CM | POA: Diagnosis not present

## 2015-07-23 DIAGNOSIS — F411 Generalized anxiety disorder: Secondary | ICD-10-CM | POA: Diagnosis present

## 2015-07-23 DIAGNOSIS — F10121 Alcohol abuse with intoxication delirium: Secondary | ICD-10-CM | POA: Diagnosis not present

## 2015-07-23 DIAGNOSIS — F1020 Alcohol dependence, uncomplicated: Secondary | ICD-10-CM | POA: Diagnosis present

## 2015-07-23 DIAGNOSIS — R45851 Suicidal ideations: Secondary | ICD-10-CM | POA: Insufficient documentation

## 2015-07-23 LAB — RAPID URINE DRUG SCREEN, HOSP PERFORMED
Amphetamines: NOT DETECTED
BARBITURATES: NOT DETECTED
BENZODIAZEPINES: POSITIVE — AB
COCAINE: NOT DETECTED
OPIATES: NOT DETECTED
TETRAHYDROCANNABINOL: NOT DETECTED

## 2015-07-23 MED ORDER — FLUOXETINE HCL 10 MG PO CAPS
10.0000 mg | ORAL_CAPSULE | Freq: Every day | ORAL | Status: DC
Start: 2015-07-23 — End: 2015-07-23
  Administered 2015-07-23: 10 mg via ORAL
  Filled 2015-07-23: qty 1

## 2015-07-23 MED ORDER — MAGNESIUM HYDROXIDE 400 MG/5ML PO SUSP: 30.0000 mL | Freq: Every day | ORAL | Status: DC | PRN

## 2015-07-23 MED ORDER — TRAZODONE HCL 100 MG PO TABS: 100.0000 mg | ORAL_TABLET | Freq: Every day | ORAL | Status: DC

## 2015-07-23 MED ORDER — TRAZODONE HCL 50 MG PO TABS: 50.0000 mg | ORAL_TABLET | Freq: Every evening | ORAL | Status: DC | PRN

## 2015-07-23 MED ORDER — ALUM & MAG HYDROXIDE-SIMETH 200-200-20 MG/5ML PO SUSP: 30.0000 mL | ORAL | Status: DC | PRN

## 2015-07-23 MED ORDER — HYDROXYZINE HCL 25 MG PO TABS: 25.0000 mg | ORAL_TABLET | Freq: Three times a day (TID) | ORAL | Status: DC | PRN

## 2015-07-23 MED ORDER — ACETAMINOPHEN 325 MG PO TABS: 650.0000 mg | ORAL_TABLET | Freq: Four times a day (QID) | ORAL | Status: DC | PRN

## 2015-07-23 NOTE — ED Notes (Signed)
[facility] contacted for transport to [facility]. 

## 2015-07-23 NOTE — Progress Notes (Addendum)
Pt confirmed with ED CM he has only been seen x 1 at [facility] "on [address]" when CM inquired if it was in [place] pt said "no"  ED CM unable to find a [facility] on [address]  Pt inquired if this would help him "get out of here quicker" CM replied generally no but referred him to his [facility] providers for d/c plans   [facility] ED CM spoke with pt on how to obtain an in network pcp with insurance coverage via the customer service number or web site  Cm reviewed ED level of care for crisis/emergent services and community pcp level of care to manage continuous or chronic medical concerns.  The pt voiced understanding CM encouraged pt and discussed pt's responsibility to verify with pt's insurance carrier that any recommended medical provider offered by any emergency room or a hospital provider is within the carrier's network. The pt voiced understanding    Entered in d/c instructions If you are unable to return to [facility] [address] doctor Please go to [email or web address]/, locate find a doctor area to use to find in network primary care provider and specialists  Please verify any provider recommended to you is in network Schedule an appointment as soon as possible for a visit As needed

## 2015-07-23 NOTE — BH Assessment (Signed)
[facility] Assessment Progress Note  Per Javier Mins, MD, this pt requires psychiatric hospitalization at this time. Rosey Bath, RN, [facility] has pre-assigned pt to [facility] Rm 307-1, however the bed is not yet vacant. She will call when ready. Pt is under IVC, and IVC documents have been faxed to [facility].  Please note that petition identifies pt as "[name]."  The Examination and Recommendation identifies pt by both names.  Jerolyn Shin at [facility] has been informed to watch for this. Pt's nurse, Carlisle Beers, has been notified of pt's pre-acceptance and agrees to call report to [phone] when the time comes. Pt is to be transported via [employer].   Doylene Canning, MA  Triage Specialist  [phone]

## 2015-07-23 NOTE — ED Notes (Signed)
Patient denies SI, HI and AVH at this time. Plan of care discussed with patient. Patient voices no complaints or concerns at this time. Encouragement and support provided and safety maintain. Q 15 min safety checks remain in place.  

## 2015-07-23 NOTE — Progress Notes (Signed)
Patient accepted to [facility] bed 307-1. Rosey Bath, RN

## 2015-07-23 NOTE — Progress Notes (Signed)
Patient alert and oriented x 3. Patient denies pain/SI/HI/AVH. Patient not aware why he is in hospital and asked, "What do I need to do to leave." Patient made aware he has been IVC and he would need to speak to doctor in the morning. Patient denies any withdraw symptoms at 0515. Patient asked, "Do I have to eat the food here? If I don't would that mean I would have to stay longer?" This writer encouraged patient that when meals are given to try to eat something on the trays. Patient reports to Clinical research associate when he is able to leave here he will be homeless.

## 2015-07-23 NOTE — ED Notes (Signed)
Pt transported to [facility] by [employer] for continuation of specialized care. Pt left in no acute distress. Belongings signed for and given to [employer] officer. Pt left in no acute distress. 
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
## 2015-07-23 NOTE — Tx Team (Signed)
Initial Interdisciplinary Treatment Plan   PATIENT STRESSORS: Health problems Legal issue Substance abuse   PATIENT STRENGTHS: Average or above average intelligence Capable of independent living   PROBLEM LIST: Problem List/Patient Goals Date to be addressed Date deferred Reason deferred Estimated date of resolution  Substance abuse 07/23/15     Suicidal Ideation 07/23/15     Depresson 07/23/15     "I want to get out of here"                                     DISCHARGE CRITERIA:  Improved stabilization in mood, thinking, and/or behavior Need for constant or close observation no longer present Withdrawal symptoms are absent or subacute and managed without 24-hour nursing intervention  PRELIMINARY DISCHARGE PLAN: Attend 12-step recovery group Outpatient therapy  PATIENT/FAMIILY INVOLVEMENT: This treatment plan has been presented to and reviewed with the patient, [name].  The patient and family have been given the opportunity to ask questions and make suggestions.  Juliann Pares 07/23/2015, 11:58 PM

## 2015-07-23 NOTE — ED Notes (Signed)
Patient disagrees with plan for inpatient admission.  Explained IVC process.  Awaiting go ahead for transfer to [facility] hall.

## 2015-07-24 ENCOUNTER — Encounter (HOSPITAL_COMMUNITY): Payer: Self-pay | Admitting: Psychiatry

## 2015-07-24 DIAGNOSIS — F1024 Alcohol dependence with alcohol-induced mood disorder: Principal | ICD-10-CM

## 2015-07-24 DIAGNOSIS — F332 Major depressive disorder, recurrent severe without psychotic features: Secondary | ICD-10-CM

## 2015-07-24 MED ORDER — THIAMINE HCL 100 MG/ML IJ SOLN: 100.0000 mg | Freq: Once | INTRAMUSCULAR | Status: DC

## 2015-07-24 MED ORDER — VITAMIN B-1 100 MG PO TABS
100.0000 mg | ORAL_TABLET | Freq: Every day | ORAL | Status: DC
Start: 2015-07-25 — End: 2015-07-27
  Administered 2015-07-27: 100 mg via ORAL
  Filled 2015-07-24 (×4): qty 1

## 2015-07-24 MED ORDER — ONDANSETRON 4 MG PO TBDP: 4.0000 mg | ORAL_TABLET | Freq: Four times a day (QID) | ORAL | Status: DC | PRN

## 2015-07-24 MED ORDER — IBUPROFEN 400 MG PO TABS
400.0000 mg | ORAL_TABLET | Freq: Four times a day (QID) | ORAL | Status: DC | PRN
  Administered 2015-07-25 – 2015-07-27 (×5): 400 mg via ORAL
  Filled 2015-07-24 (×5): qty 1

## 2015-07-24 MED ORDER — ADULT MULTIVITAMIN W/MINERALS CH
1.0000 | ORAL_TABLET | Freq: Every day | ORAL | Status: DC
  Administered 2015-07-27: 1 via ORAL
  Filled 2015-07-24 (×5): qty 1

## 2015-07-24 MED ORDER — LORAZEPAM 1 MG PO TABS: 1.0000 mg | ORAL_TABLET | Freq: Four times a day (QID) | ORAL | Status: DC | PRN

## 2015-07-24 MED ORDER — HYDROXYZINE HCL 25 MG PO TABS: 25.0000 mg | ORAL_TABLET | Freq: Four times a day (QID) | ORAL | Status: DC | PRN

## 2015-07-24 MED ORDER — LOPERAMIDE HCL 2 MG PO CAPS: 2.0000 mg | ORAL_CAPSULE | ORAL | Status: DC | PRN

## 2015-07-24 NOTE — [facility] Suicide Risk Assessment (Signed)
[facility] INPATIENT:  Family/Significant Other Suicide Prevention Education  Suicide Prevention Education:  Patient Refusal for Family/Significant Other Suicide Prevention Education: The patient [name] has refused to provide written consent for family/significant other to be provided Family/Significant Other Suicide Prevention Education during admission and/or prior to discharge.  Physician notified.  Sarina Ser 07/24/2015, 6:23 PM
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
## 2015-07-24 NOTE — [facility] Group Notes (Addendum)
[place] Group Notes:  (Nursing/MHT/Case Management/Adjunct)  Date:  07/24/2015  Time:  1000  Type of Therapy:  Nurse Education  /  Life Skills : The group is focused on teaching patients how to identify their needs and then how to identify healthy ways to get them met.  Participation Level:  n/a  Participation Quality:  n/a  Affect: n/a   Cognitive:  n/a  Insight: n/a   Engagement in Group:  n/a  Modes of Intervention:  n/a  Summary of Progress/Problems:  [name] 07/24/2015, 4:22 PM

## 2015-07-24 NOTE — Progress Notes (Signed)
D.  Pt in bed on approach, would not answer questions, minimal interaction.  Pt did not get up for group, has not been out of bed this shift.  A.  Support and encouragement offered  R.  Will continue to monitor, pt remains safe on unit.

## 2015-07-24 NOTE — Progress Notes (Signed)
D) Pt stayed in bed much of the morning. Would not converse with this Clinical research associate. When the NP came he was also refusing to speak with her. Stating, "I shouldn't be here. They should have taken me to jail and let me spend the night. I would have been released the next day. Pt angry that he is here and states, "I won't eat anything and I won't take any pills. You can shove them down my throat and make me take them, but I won't take them willingly. A) After allowing Pt to burn off a little steam, Pt was able to talk with the NP and this Clinical research associate. States that he has no place to go and would end up on the street. Given support and reassurance. Allowing to vent his feelings. Provided with a 1:1 R) Pt denies SI and HI. Adjusting to the unit.
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
## 2015-07-24 NOTE — Progress Notes (Signed)
Patient did not attend the evening speaker AA meeting. Pt was asleep during group time.
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
## 2015-07-24 NOTE — [facility] Group Notes (Signed)
[facility] Group Notes:  (Clinical Social Work)   07/24/2015 1:15-2:15PM  Summary of Progress/Problems:   Today's process group involved patients discussing their feelings related to being hospitalized, as well as how they can use their present feelings to create a goal for the day.  A lengthy list of unhealthy coping techniques was discussed and group was able to state the connection between having a need and using a coping technique to feel that need, sometimes unhealthy and sometimes healthy.  Motivational interviewing was used to bring about a discussion about making choices about the unhealthy coping skills identified by each patient.   The patient expressed a primary feeling about being hospitalized of "I can't stand it, it's a waste of time."  He stated he has been hospitalized 3-4 times previously and it's always the same thing.  "Stay a few days, then get out." The patient expressed that the unhealthy coping he often uses is drugs and alcohol.  He was initially sitting on the floor even though there were available chairs, then he moved to standing across the room, then he left the room, then he came back and made noise getting a bench to sit on.  Type of Therapy:  Group Therapy - Process   Participation Level:  Active  Participation Quality:  Resistant  Affect:  Irritable  Cognitive:  Disorganized  Insight:  Limited  Engagement in Therapy:  Improving  Modes of Intervention:  Discussion, Motivational Interviewing  Ambrose Mantle, LCSW 07/24/2015, 5:18 PM

## 2015-07-25 NOTE — Progress Notes (Signed)
[facility] MD Progress Note  07/25/2015 5:17 PM [name]  MRN:  [id number] Subjective:  Patient reports "I am good, just ready to go"  Objective: Patient is AAO X3, Patient found interacting with peers.  Patient is guarded and flat. Reports "I don't need anything, I am just ready to go". Patient reports he is followed by [facility] and doesn't take mediations as prescribed. Reports right shoulder pain.8/10 pain. I only want take ibuprofen for pain and nothing else.  Patient denies depression. Denies suicidal or homicidal ideations. Support, encouragement and reassurances was provided Principal Problem: Alcohol use disorder, severe, dependence (HCC) Diagnosis:   Patient Active Problem List   Diagnosis Date Noted  . Alcohol dependence with alcohol-induced mood disorder (HCC) [F10.24]   . Alcohol use disorder, severe, dependence (HCC) [F10.20] 07/23/2015  . Severe recurrent major depression without psychotic features (HCC) [F33.2] 07/23/2015  . Major depressive disorder, recurrent severe without psychotic features (HCC) [F33.2] 07/23/2015  . Substance abuse [F19.10]   . Suicidal ideation [R45.851]    Total Time spent with patient: 45 minutes  Past Psychiatric History: SEE ABOVE  Past Medical History:  Past Medical History  Diagnosis Date  . Depression    History reviewed. No pertinent past surgical history. Family History: History reviewed. No pertinent family history. Family Psychiatric  History: sisters; Polysubstance's abuse Social History:  History  Alcohol Use  . Yes     History  Drug Use  . Yes  . Special: Marijuana    Social History   Social History  . Marital Status: Married    Spouse Name: N/A  . Number of Children: N/A  . Years of Education: N/A   Social History Main Topics  . Smoking status: Current Every Day Smoker -- 1.00 packs/day    Types: Cigarettes  . Smokeless tobacco: None  . Alcohol Use: Yes  . Drug Use: Yes    Special: Marijuana  . Sexual  Activity: Not Asked     Comment: Pt uncooperative with admissin interview   Other Topics Concern  . None   Social History Narrative   Additional Social History:    History of alcohol / drug use?: Yes Negative Consequences of Use: Legal, Personal relationships Withdrawal Symptoms: Agitation                    Sleep: Fair  Appetite:  Fair  Current Medications: Current Facility-Administered Medications  Medication Dose Route Frequency Provider Last Rate Last Dose  . acetaminophen (TYLENOL) tablet 650 mg  650 mg Oral Q6H PRN Worthy Flank, NP      . alum & mag hydroxide-simeth (MAALOX/MYLANTA) 200-200-20 MG/5ML suspension 30 mL  30 mL Oral Q4H PRN Worthy Flank, NP      . hydrOXYzine (ATARAX/VISTARIL) tablet 25 mg  25 mg Oral Q6H PRN Craige Cotta, MD      . ibuprofen (ADVIL,MOTRIN) tablet 400 mg  400 mg Oral Q6H PRN Craige Cotta, MD   400 mg at 07/25/15 0921  . loperamide (IMODIUM) capsule 2-4 mg  2-4 mg Oral PRN Craige Cotta, MD      . LORazepam (ATIVAN) tablet 1 mg  1 mg Oral Q6H PRN Rockey Situ Laporcha Marchesi, MD      . magnesium hydroxide (MILK OF MAGNESIA) suspension 30 mL  30 mL Oral Daily PRN Worthy Flank, NP      . multivitamin with minerals tablet 1 tablet  1 tablet Oral Daily Craige Cotta, MD   1  tablet at 07/24/15 1911  . ondansetron (ZOFRAN-ODT) disintegrating tablet 4 mg  4 mg Oral Q6H PRN Rockey Situ Salimata Christenson, MD      . thiamine (B-1) injection 100 mg  100 mg Intramuscular Once Craige Cotta, MD   100 mg at 07/25/15 1559  . thiamine (VITAMIN B-1) tablet 100 mg  100 mg Oral Daily Rockey Situ Tabius Rood, MD   100 mg at 07/25/15 1600  . traZODone (DESYREL) tablet 50 mg  50 mg Oral QHS PRN Worthy Flank, NP        Lab Results: No results found for this or any previous visit (from the past 48 hour(s)).  Physical Findings: AIMS: Facial and Oral Movements Muscles of Facial Expression: None, normal Lips and Perioral Area: None, normal Jaw: None,  normal Tongue: None, normal,Extremity Movements Upper (arms, wrists, hands, fingers): None, normal Lower (legs, knees, ankles, toes): None, normal, Trunk Movements Neck, shoulders, hips: None, normal, Overall Severity Severity of abnormal movements (highest score from questions above): None, normal Incapacitation due to abnormal movements: None, normal Patient's awareness of abnormal movements (rate only patient's report): No Awareness, Dental Status Current problems with teeth and/or dentures?: No Does patient usually wear dentures?: No  CIWA:  CIWA-Ar Total: 0 COWS:     Musculoskeletal: Strength & Muscle Tone: within normal limits Gait & Station: normal Patient leans: N/A  Psychiatric Specialty Exam: Review of Systems  Psychiatric/Behavioral: Positive for depression and substance abuse. Negative for suicidal ideas and hallucinations. The patient is nervous/anxious.   All other systems reviewed and are negative.   Blood pressure 126/91, pulse 96, temperature 98.2 F (36.8 C), temperature source Oral, resp. rate 16, height  (1.854 m), weight 97.977 kg (216 lb).Body mass index is 28.5 kg/(m^2).  General Appearance: Casual  Eye Contact::  Fair  Speech:  Normal Rate and Pressured  Volume:  Normal  Mood:  Anxious  Affect:  Congruent  Thought Process:  Intact  Orientation:  Full (Time, Place, and Person)  Thought Content:  Hallucinations: None  Suicidal Thoughts:  No  Homicidal Thoughts:  No  Memory:  Immediate;   Fair Recent;   Fair Remote;   Fair  Judgement:  Intact  Insight:  Lacking  Psychomotor Activity:  Restlessness  Concentration:  Fair  Recall:  [employer] of Knowledge:Fair  Language: Fair  Akathisia:  No  Handed:  Right  AIMS (if indicated):     Assets:  Financial Resources/Insurance Housing  ADL's:  Intact  Cognition: WNL  Sleep:  Number of Hours: 4.5      I agree with current treatment plan on 07/25/2015, Patient seen face-to-face for psychiatric  evaluation follow-up, chart reviewed. Reviewed the information documented and agree with the treatment plan.  Treatment Plan Summary: Daily contact with patient to assess and evaluate symptoms and progress in treatment and Medication management   Continue with Trazodone 50 mg PO QHS for insomnia Started on CWIA/AtivanProtocol  Will continue to monitor vitals ,medication compliance and treatment side effects while patient is here.  Reviewed labs Glucose 101 elevated ,BAL -234, UDS - positive for benzodizpines. CSW will start working on disposition.  Patient to participate in therapeutic milieu  Oneta Rack FNP- [facility] 07/25/2015, 5:17 PM Agree with NP Progress Note, as above

## 2015-07-25 NOTE — Progress Notes (Signed)
Patient did attend the evening speaker AA meeting.  

## 2015-07-25 NOTE — Progress Notes (Signed)
D.  Pt surly on approach, denies complaints at this time other than being here.  Pt very upset about being involuntarily here.  Pt did attend evening AA group, denied withdrawal symptoms at this time.  Minimal interaction.  Denies SI/HI/hallucinations at this time.  A.  Support and encouragement offered  R.  Pt remains safe on unit, will continue to monitor.

## 2015-07-25 NOTE — [facility] Group Notes (Signed)
[facility] Group Notes:  (Clinical Social Work)  07/25/2015  1:15-2:30PM  Summary of Progress/Problems:   The main focus of today's process group was to   1)  discuss the importance of adding supports  2)  define health supports versus unhealthy supports  3)  identify the patient's current unhealthy supports and plan how to handle them  4)  Identify the patient's current healthy supports and plan what to add.  An emphasis was placed on using counselor, doctor, therapy groups, 12-step groups, and problem-specific support groups to expand supports.    The patient expressed little that was helpful during group, continued to be resistant and at times defiant.  He was distracting when he came in and went out of the room a couple of times, but was not actually loud at those times.  Type of Therapy:  Process Group with Motivational Interviewing  Participation Level:  Minimal  Participation Quality:  Inattentive and Resistant  Affect:  Excited  Cognitive:  Disorganized  Insight:  Limited  Engagement in Therapy:  Limited  Modes of Intervention:   Education, Support and Processing, Activity  Ambrose Mantle, LCSW 07/25/2015

## 2015-07-25 NOTE — Progress Notes (Signed)
D) Pt has been in his room and went to groups because "if I don't I will never get out of this F---ing place". Pt is limited in his interaction and states "I don't want to be here at all". Pt has little insight. Rates his depression, hopelessness, and anxiety all at a 0. Denies SI and HI. A) Pt given support and reassurance. Attempts at 1:1 made,  R) Pt has limited insight. Does not want to be here and can be disruptive in group.

## 2015-07-25 NOTE — [facility] Group Notes (Signed)
[facility] Group Notes:  (Nursing/MHT/Case Management/Adjunct)  Date:  07/25/2015  Time:  1000  Type of Therapy:  Nurse Education  /  Life SKills : The group is focused on teaching patients how to develop healthy support systems.  Participation Level:  Minimal  Participation Quality:  Attentive  Affect:  Anxious  Cognitive:  Alert  Insight:  Improving  Engagement in Group:  Improving  Modes of Intervention:  Education  Summary of Progress/Problems:  [name] 07/25/2015, 12:22 PM

## 2015-07-25 NOTE — Progress Notes (Signed)
Adult Psychoeducational Group Note  Date:  07/25/2015 Time:  9:14 PM  Group Topic/Focus:  Wrap-Up Group:   The focus of this group is to help patients review their daily goal of treatment and discuss progress on daily workbooks.  Participation Level:  Minimal  Participation Quality:  Appropriate  Affect:  Flat  Cognitive:  Appropriate  Insight: Appropriate  Engagement in Group:  Limited  Modes of Intervention:  Socialization and Support  Additional Comments:  Patient attended and participated in group tonight. He reports that today he smiles more.  He advised that he has a good spirit.  Lita [facility] 07/25/2015, 9:14 PM

## 2015-07-26 ENCOUNTER — Encounter (HOSPITAL_COMMUNITY): Payer: Self-pay | Admitting: Psychiatry

## 2015-07-26 DIAGNOSIS — F1020 Alcohol dependence, uncomplicated: Secondary | ICD-10-CM

## 2015-07-26 MED ORDER — LORAZEPAM 1 MG PO TABS: 1.0000 mg | ORAL_TABLET | ORAL | Status: DC | PRN

## 2015-07-26 MED ORDER — QUETIAPINE FUMARATE 50 MG PO TABS
50.0000 mg | ORAL_TABLET | Freq: Two times a day (BID) | ORAL | Status: DC
  Administered 2015-07-26 – 2015-07-27 (×2): 50 mg via ORAL
  Filled 2015-07-26 (×6): qty 1

## 2015-07-26 MED ORDER — QUETIAPINE FUMARATE 100 MG PO TABS
100.0000 mg | ORAL_TABLET | Freq: Every day | ORAL | Status: DC
  Administered 2015-07-26: 100 mg via ORAL
  Filled 2015-07-26 (×3): qty 1

## 2015-07-26 MED ORDER — SERTRALINE HCL 25 MG PO TABS
25.0000 mg | ORAL_TABLET | Freq: Every day | ORAL | Status: DC
  Administered 2015-07-26 – 2015-07-27 (×2): 25 mg via ORAL
  Filled 2015-07-26 (×5): qty 1

## 2015-07-26 NOTE — Plan of Care (Signed)
Problem: Alteration in mood Goal: STG-Patient is able to discuss feelings and issues (Patient is able to discuss feelings and issues leading to depression)  Outcome: Not Progressing Patient is very minimal, refuses to acknowledge that he needs to be at this hospital.

## 2015-07-26 NOTE — Plan of Care (Signed)
Problem: Alteration in mood & ability to function due to Goal: STG-Patient will attend groups Outcome: Progressing Pt did attend all groups today

## 2015-07-26 NOTE — Progress Notes (Signed)
Patient ID: [name], male   DOB: Nov 01, 1975, 40 y.o.   MRN: [id number]  DAR: Pt. Denies SI/HI and A/V Hallucinations. He reports sleep is good, appetite is good, energy level is high, and concentration is good. He rates depression, anxiety, and hopelessness 0/10. He is focused on discharge. Patient does not report any pain or discomfort at this time. Support and encouragement provided to the patient. Scheduled medication patient refused however this afternoon he did take scheduled medication. Patient is minimal with staff but can be seen in the milieu speaking with his peers. Q15 minute checks are maintained for safety.

## 2015-07-26 NOTE — Progress Notes (Signed)
[facility] MD Progress Note  07/26/2015 10:13 AM [name]  MRN:  [id number] Subjective:  [name] is having a hard time. He does not want to be here. Initially did not want to take medications but later on said he needed some to help with his anxiety-panic-agitation. 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 His mother went by and picked up his things. She is not allowing back with her and is working on finding a place for him. Principal Problem: Alcohol use disorder, severe, dependence (HCC) Diagnosis:   Patient Active Problem List   Diagnosis Date Noted  . Alcohol dependence with alcohol-induced mood disorder (HCC) [F10.24]   . Alcohol use disorder, severe, dependence (HCC) [F10.20] 07/23/2015  . Severe recurrent major depression without psychotic features (HCC) [F33.2] 07/23/2015  . Major depressive disorder, recurrent severe without psychotic features (HCC) [F33.2] 07/23/2015  . Substance abuse [F19.10]   . Suicidal ideation [R45.851]    Total Time spent with patient: 30 minutes  Past Psychiatric History: see admission H and P  Past Medical History:  Past Medical History  Diagnosis Date  . Depression    History reviewed. No pertinent past surgical history. Family History: History reviewed. No pertinent family history. Family Psychiatric  History: see admission H and P Social History:  History  Alcohol Use  . Yes     History  Drug Use  . Yes  . Special: Marijuana    Social History   Social History  . Marital Status: Married    Spouse Name: N/A  . Number of Children: N/A  . Years of Education: N/A   Social History Main Topics  . Smoking status: Current Every Day Smoker -- 1.00 packs/day    Types: Cigarettes  . Smokeless tobacco: None  . Alcohol Use: Yes  . Drug Use: Yes    Special: Marijuana  . Sexual Activity: Not Asked     Comment: Pt uncooperative with admissin interview   Other Topics  Concern  . None   Social History Narrative   Additional Social History:    History of alcohol / drug use?: Yes Negative Consequences of Use: Legal, Personal relationships Withdrawal Symptoms: Agitation                    Sleep: Fair  Appetite:  Fair  Current Medications: Current Facility-Administered Medications  Medication Dose Route Frequency Provider Last Rate Last Dose  . acetaminophen (TYLENOL) tablet 650 mg  650 mg Oral Q6H PRN Worthy Flank, NP      . alum & mag hydroxide-simeth (MAALOX/MYLANTA) 200-200-20 MG/5ML suspension 30 mL  30 mL Oral Q4H PRN Worthy Flank, NP      . hydrOXYzine (ATARAX/VISTARIL) tablet 25 mg  25 mg Oral Q6H PRN Craige Cotta, MD      . ibuprofen (ADVIL,MOTRIN) tablet 400 mg  400 mg Oral Q6H PRN Craige Cotta, MD   400 mg at 07/26/15 0631  . loperamide (IMODIUM) capsule 2-4 mg  2-4 mg Oral PRN Craige Cotta, MD      . LORazepam (ATIVAN) tablet 1 mg  1 mg Oral Q6H PRN Rockey Situ Cobos, MD      . magnesium hydroxide (MILK OF MAGNESIA) suspension 30 mL  30 mL Oral Daily PRN Worthy Flank, NP      . multivitamin with minerals tablet 1 tablet  1 tablet  Oral Daily Craige Cotta, MD   1 tablet at 07/24/15 1911  . ondansetron (ZOFRAN-ODT) disintegrating tablet 4 mg  4 mg Oral Q6H PRN Rockey Situ Cobos, MD      . thiamine (B-1) injection 100 mg  100 mg Intramuscular Once Craige Cotta, MD   100 mg at 07/25/15 1559  . thiamine (VITAMIN B-1) tablet 100 mg  100 mg Oral Daily Rockey Situ Cobos, MD   100 mg at 07/25/15 1600  . traZODone (DESYREL) tablet 50 mg  50 mg Oral QHS PRN Worthy Flank, NP        Lab Results: No results found for this or any previous visit (from the past 48 hour(s)).  Physical Findings: AIMS: Facial and Oral Movements Muscles of Facial Expression: None, normal Lips and Perioral Area: None, normal Jaw: None, normal Tongue: None, normal,Extremity Movements Upper (arms, wrists, hands, fingers): None,  normal Lower (legs, knees, ankles, toes): None, normal, Trunk Movements Neck, shoulders, hips: None, normal, Overall Severity Severity of abnormal movements (highest score from questions above): None, normal Incapacitation due to abnormal movements: None, normal Patient's awareness of abnormal movements (rate only patient's report): No Awareness, Dental Status Current problems with teeth and/or dentures?: No Does patient usually wear dentures?: No  CIWA:  CIWA-Ar Total: 0 COWS:     Musculoskeletal: Strength & Muscle Tone: within normal limits Gait & Station: normal Patient leans: normal  Psychiatric Specialty Exam: Review of Systems  Constitutional: Negative.   HENT: Negative.   Eyes: Negative.   Respiratory: Negative.   Cardiovascular: Negative.   Gastrointestinal: Negative.   Genitourinary: Negative.   Musculoskeletal: Negative.   Skin: Negative.   Neurological: Negative.   Endo/Heme/Allergies: Negative.   Psychiatric/Behavioral: Positive for depression and substance abuse.    Blood pressure 132/80, pulse 77, temperature 97.7 F (36.5 C), temperature source Oral, resp. rate 16, height  (1.854 m), weight 97.977 kg (216 lb).Body mass index is 28.5 kg/(m^2).  General Appearance: Fairly Groomed  Patent attorney::  Fair  Speech:  Clear and Coherent  Volume:  fluctuates  Mood:  Anxious, Dysphoric and Irritable  Affect:  Labile and irritable  Thought Process:  Coherent and Goal Directed  Orientation:  Full (Time, Place, and Person)  Thought Content:  events worries concerns  Suicidal Thoughts:  No  Homicidal Thoughts:  No  Memory:  Immediate;   Fair Recent;   Fair Remote;   Fair  Judgement:  Fair  Insight:  Shallow  Psychomotor Activity:  Restlessness  Concentration:  Fair  Recall:  [employer] of Knowledge:Fair  Language: Fair  Akathisia:  No  Handed:  Right  AIMS (if indicated):     Assets:  Vocational/Educational  ADL's:  Intact  Cognition: WNL  Sleep:   Number of Hours: 5.75   Treatment Plan Summary: Daily contact with patient to assess and evaluate symptoms and progress in treatment and Medication management Supportive approach/coping skills Alcohol dependence; work a relapse prevention plan Anxiety-agitation; will use Seroquel 50 mg BID 100 mg HS Depression; Zoloft 25 mg daily ( states he used Zoloft before and would be willing to take it) Anxiety-withdrawal; will have the Ativan 1 mg Q 6 PRN Work with CBT/mindfulness Explore placement options Kali Ambler A 07/26/2015, 10:13 AM

## 2015-07-26 NOTE — Tx Team (Addendum)
Interdisciplinary Treatment Plan Update (Adult) Date: 07/26/2015    Time Reviewed: 9:30 AM  Progress in Treatment: Attending groups: Continuing to assess, patient new to milieu Participating in groups: Continuing to assess, patient new to milieu Taking medication as prescribed: Yes Tolerating medication: Yes Family/Significant other contact made: Yes, CSW has spoken with mother Patient understands diagnosis: Yes Discussing patient identified problems/goals with staff: Yes Medical problems stabilized or resolved: Yes Denies suicidal/homicidal ideation: Yes Issues/concerns per patient self-inventory: Yes Other:  New problem(s) identified: N/A  Discharge Plan or Barriers: Patient will likely discharge to [facility] for shelter placement or will stay with mother, patient has declined referral for outpatient services.   Reason for Continuation of Hospitalization:  Depression Anxiety Medication Stabilization   Comments: N/A  Estimated length of stay: 1-2 days    Patient is a 40yo male admitted with a diagnosis of Major Depression. Patient presented to the hospital with IVC papers and reports primary trigger for admission was his current living conditions. Patient will benefit from crisis stabilization, medication evaluation, group therapy and psychoeducation, in addition to case management for discharge planning. At discharge it is recommended that Patient adhere to the established discharge plan and continue in treatment.   Review of initial/current patient goals per problem list:  1. Goal(s): Patient will participate in aftercare plan   Met: Yes   Target date: 3-5 days post admission date   As evidenced by: Patient will participate within aftercare plan AEB aftercare provider and housing plan at discharge being identified.  1/23: Goal met. Patient plans to return to previous living situation and declines referral for outpatient services.  1/24: Goal met. Patient will likely  discharge to [facility] for shelter placement or will stay with mother, patient has declined referral for outpatient services.    2. Goal (s): Patient will exhibit decreased depressive symptoms and suicidal ideations.   Met: Yes   Target date: 3-5 days post admission date   As evidenced by: Patient will utilize self rating of depression at 3 or below and demonstrate decreased signs of depression or be deemed stable for discharge by MD.  1/23: Goal met. Patient reports low depression level today, denies SI.   3. Goal(s): Patient will demonstrate decreased signs and symptoms of anxiety.   Met: Yes   Target date: 3-5 days post admission date   As evidenced by: Patient will utilize self rating of anxiety at 3 or below and demonstrated decreased signs of anxiety, or be deemed stable for discharge by MD  1/23: Goal met. Patient reports low anxiety level today.    4. Goal(s): Patient will demonstrate decreased signs of withdrawal due to substance abuse   Met: Yes   Target date: 3-5 days post admission date   As evidenced by: Patient will produce a CIWA/COWS score of 0, have stable vitals signs, and no symptoms of withdrawal  1/23: Goal met. No withdrawal symptoms reported at this time per medical chart.      Attendees: Patient:    Family:    Physician: Dr. Shea Evans; Dr. Sabra Heck 07/26/2015 9:30 AM  Nursing: Darrol Angel, Grayland Ormond, Janann August, [place], RN 07/26/2015 9:30 AM  Clinical Social Worker: Tilden Fossa, [place] 07/26/2015 9:30 AM  Other: Peri Maris, LCSWA; [place], LCSW  07/26/2015 9:30 AM   07/26/2015 9:30 AM   07/26/2015 9:30 AM  Other: Andria Rhein, NP 07/26/2015 9:30 AM  Other:    Other:      Scribe for Treatment Team:  Erasmo Downer  [place], MSW, [place]

## 2015-07-26 NOTE — BHH Group Notes (Signed)
   [facility] LCSW Aftercare Discharge Planning Group Note  07/26/2015  8:45 AM   Participation Quality: Alert, Appropriate and Oriented  Mood/Affect: Anxious  Depression Rating: Rates depression low today  Anxiety Rating: Rates anxiety low today  Thoughts of Suicide: Pt denies SI/HI  Will you contract for safety? Yes  Current AVH: Pt denies  Plan for Discharge/Comments: Pt attended discharge planning group and actively participated in group. CSW provided pt with today's workbook. Patient plans to return to previous living situation and declines referral for outpatient treatment.  Transportation Means: Pt reports access to transportation  Supports: No supports mentioned at this time  Samuella Bruin, MSW, [employer] Clinical Social Worker [employer] [phone]

## 2015-07-26 NOTE — Progress Notes (Signed)
Recreation Therapy Notes  Date: 01.23.2017 Time: 9:30am Location: [address] Group Room   Group Topic: Stress Management  Goal Area(s) Addresses:  Patient will actively participate in stress management techniques presented during session.   Behavioral Response: Did not attend.   Cherese Lozano L Xaviar Lunn, LRT/CTRS        Lealand Elting L 07/26/2015 3:38 PM 

## 2015-07-27 MED ORDER — TRAZODONE HCL 50 MG PO TABS: 50.0000 mg | ORAL_TABLET | Freq: Every evening | ORAL | Status: AC | PRN

## 2015-07-27 MED ORDER — QUETIAPINE FUMARATE 50 MG PO TABS: 50.0000 mg | ORAL_TABLET | Freq: Two times a day (BID) | ORAL | Status: AC

## 2015-07-27 MED ORDER — THIAMINE HCL 100 MG PO TABS: 100.0000 mg | ORAL_TABLET | Freq: Every day | ORAL | Status: AC

## 2015-07-27 MED ORDER — QUETIAPINE FUMARATE 100 MG PO TABS: 100.0000 mg | ORAL_TABLET | Freq: Every day | ORAL | Status: AC

## 2015-07-27 MED ORDER — SERTRALINE HCL 25 MG PO TABS: 25.0000 mg | ORAL_TABLET | Freq: Every day | ORAL | Status: AC

## 2015-07-27 MED ORDER — HYDROXYZINE HCL 25 MG PO TABS: 25.0000 mg | ORAL_TABLET | Freq: Four times a day (QID) | ORAL | Status: AC | PRN

## 2015-07-27 NOTE — BHH Group Notes (Signed)
Late entry from 07/26/15:  [facility] LCSW Group Therapy 07/26/15  1:15 pm  Type of Therapy: Group Therapy Participation Level: Minimal  Participation Quality: Limited  Affect: Agitated  Cognitive: Alert and Oriented  Insight: Developing/Improving and Engaged  Engagement in Therapy: Developing/Improving and Engaged  Modes of Intervention: Clarification, Confrontation, Discussion, Education, Exploration,  Limit-setting, Orientation, Problem-solving, Rapport Building, Dance movement psychotherapist, Socialization and Support  Summary of Progress/Problems: Pt identified obstacles faced currently and processed barriers involved in overcoming these obstacles. Pt identified steps necessary for overcoming these obstacles and explored motivation (internal and external) for facing these difficulties head on. Pt further identified one area of concern in their lives and chose a goal to focus on for today. Patient declined to participate in discussion despite CSW encouragement.  Samuella Bruin, MSW, [employer] Clinical Social Worker [facility] [phone]

## 2015-07-27 NOTE — Progress Notes (Signed)
Discharge note:  Patient discharged home per MD order.  Patient did not want to wait for a bus ticket.  He states, "I got my own money.  I just want to leave now."  Patient received all personal belongings.  Reviewed AVS/Discharge instructions with patient and he indicated understanding.  Reviewed medications with patient and he was given prescriptions.  He denies SI/lHI/AVH.  Patient left ambulatory for the bus station.

## 2015-07-27 NOTE — BHH Group Notes (Signed)
B[facility] Group Notes:  (Nursing/MHT/Case Management/Adjunct)  Date:  07/27/2015  Time:  0845  Type of Therapy:  Psychoeducational Skills  Participation Level:  Did Not Attend  [name] 07/27/2015, 9:00 AM

## 2015-07-27 NOTE — BHH Suicide Risk Assessment (Signed)
BHH INPATIENT:  Family/Significant Other Suicide Prevention Education  Suicide Prevention Education:  Education Completed; mother Jerrol Banana [phone],  (name of family member/significant other) has been identified by the patient as the family member/significant other with whom the patient will be residing, and identified as the person(s) who will aid the patient in the event of a mental health crisis (suicidal ideations/suicide attempt).  With written consent from the patient, the family member/significant other has been provided the following suicide prevention education, prior to the and/or following the discharge of the patient.  The suicide prevention education provided includes the following:  Suicide risk factors  Suicide prevention and interventions  National Suicide Hotline telephone number  [facility] assessment telephone number  [facility] Emergency Assistance 911  [facility] and/or Residential Mobile Crisis Unit telephone number  Request made of family/significant other to:  Remove weapons (e.g., guns, rifles, knives), all items previously/currently identified as safety concern.    Remove drugs/medications (over-the-counter, prescriptions, illicit drugs), all items previously/currently identified as a safety concern.  The family member/significant other verbalizes understanding of the suicide prevention education information provided.  The family member/significant other agrees to remove the items of safety concern listed above.  Kewanna Kasprzak, [place] 07/27/2015, 8:52 AM

## 2015-07-27 NOTE — Progress Notes (Signed)
Pt did not attend wrap up group. RN notified.  
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
## 2015-07-27 NOTE — Progress Notes (Signed)
D: Patient observed in dayroom with peers. Patient states goal today was " to stay out of trouble."  Patient denies SI/HI A/V hallucinations.  A: Support and encouragement offered. Patient administered medications as ordered and results monitored. Q 15 minute checks in progress and maintained for safety.  R: Patient remains safe on unit. Monitoring continues.

## 2015-07-27 NOTE — Progress Notes (Addendum)
  [facility] Adult Case Management Discharge Plan :  Will you be returning to the same living situation after discharge:  No. Patient will go to [facility] for shelter placement or stay with mother At discharge, do you have transportation home?: Yes,  patient will be provided with bus pass or mother to pick up Do you have the ability to pay for your medications: Yes,  patient will be provided with prescriptions at discharge  Release of information consent forms completed and in the chart;  Patient's signature needed at discharge.  Patient to Follow up at: Follow-up Information    Follow up with Patient refuses referral for follow-up.      Next level of care provider has access to [facility] Link:no  Safety Planning and Suicide Prevention discussed: Yes,  with patient and mother  Have you used any form of tobacco in the last 30 days? (Cigarettes, Smokeless Tobacco, Cigars, and/or Pipes): Yes  Has patient been referred to the Quitline?: Patient refused referral  Patient has been referred for addiction treatment: Pt. refused referral  Mishel Sans, [place] 07/27/2015, 10:29 AM

## 2015-07-27 NOTE — Plan of Care (Signed)
Problem: Ineffective individual coping Goal: LTG: Patient will report a decrease in negative feelings Outcome: Not Progressing Patient continues to voice negative feedback.  Wrote on his wall, "feel like a rat in a cage."

## 2015-07-27 NOTE — BHH Suicide Risk Assessment (Signed)
[facility] Discharge Suicide Risk Assessment   Principal Problem: Alcohol use disorder, severe, dependence (HCC) Discharge Diagnoses:  Patient Active Problem List   Diagnosis Date Noted  . Alcohol dependence with alcohol-induced mood disorder (HCC) [F10.24]   . Alcohol use disorder, severe, dependence (HCC) [F10.20] 07/23/2015  . Severe recurrent major depression without psychotic features (HCC) [F33.2] 07/23/2015  . Major depressive disorder, recurrent severe without psychotic features (HCC) [F33.2] 07/23/2015  . Substance abuse [F19.10]   . Suicidal ideation [R45.851]     Total Time spent with patient: 20 minutes  Musculoskeletal: Strength & Muscle Tone: within normal limits Gait & Station: normal Patient leans: normal  Psychiatric Specialty Exam: Review of Systems  Constitutional: Negative.   HENT: Negative.   Eyes: Negative.   Respiratory: Negative.   Cardiovascular: Negative.   Gastrointestinal: Negative.   Genitourinary: Negative.   Musculoskeletal: Negative.   Skin: Negative.   Neurological: Negative.   Endo/Heme/Allergies: Negative.   Psychiatric/Behavioral: Positive for substance abuse.    Blood pressure 110/77, pulse 57, temperature 98.2 F (36.8 C), temperature source Oral, resp. rate 18, height  (1.854 m), weight 97.977 kg (216 lb).Body mass index is 28.5 kg/(m^2).  General Appearance: Fairly Groomed  Patent attorney::  Fair  Speech:  Clear and Coherent409  Volume:  Normal  Mood:  Euthymic  Affect:  Restricted  Thought Process:  Coherent and Goal Directed  Orientation:  Full (Time, Place, and Person)  Thought Content:  plans as he moves on, relapse prevention plan  Suicidal Thoughts:  No  Homicidal Thoughts:  No  Memory:  Immediate;   Fair Recent;   Fair Remote;   Fair  Judgement:  Fair  Insight:  Present and Shallow  Psychomotor Activity:  Normal  Concentration:  Fair  Recall:  [employer] of Knowledge:Fair  Language: Fair  Akathisia:  No  Handed:   Right  AIMS (if indicated):     Assets:  Desire for Improvement Social Support  Sleep:  Number of Hours: 6.5  Cognition: WNL  ADL's:  Intact  In full contact with reality. There are no active S/S of withdrawal. There are no active SI plans or intent. 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 He also knows that his mother is working towards getting him an apartment. He accepts the fact that this trouble was caused by his drinking. States he is going to work on abstinence. He states he is ready to be D/C home Mental Status Per Nursing Assessment::   On Admission:  Suicidal ideation indicated by others, Self-harm behaviors, Thoughts of violence towards others  Demographic Factors:  Male and Caucasian  Loss Factors: Loss of significant relationship  Historical Factors: NA  Risk Reduction Factors:   Sense of responsibility to family and Positive social support  Continued Clinical Symptoms:  Depression:   Comorbid alcohol abuse/dependence Alcohol/Substance Abuse/Dependencies  Cognitive Features That Contribute To Risk:  Closed-mindedness, Polarized thinking and Thought constriction (tunnel vision)    Suicide Risk:  Minimal: No identifiable suicidal ideation.  Patients presenting with no risk factors but with morbid ruminations; may be classified as minimal risk based on the severity of the depressive symptoms  Follow-up Information    Follow up with Patient refuses referral for follow-up.      Plan Of Care/Follow-up recommendations:  Activity:  as tolerated Diet:  regular  Daritza Brees A, MD 07/27/2015, 11:52 AM
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
## 2017-07-02 IMAGING — CT CT MAXILLOFACIAL W/O CM
3 of 6 series · 5 of 16 positions shown, 6 images · non-contrast
Comparison: None.

CLINICAL DATA: Fall

EXAM:
CT HEAD WITHOUT CONTRAST
CT MAXILLOFACIAL WITHOUT CONTRAST
CT CERVICAL SPINE WITHOUT CONTRAST
TECHNIQUE: Multidetector CT imaging of the head, cervical spine, and
maxillofacial structures were performed using the standard protocol
without intravenous contrast. Multiplanar CT image reconstructions
of the cervical spine and maxillofacial structures were also
generated.

[Series 5: c-spine st · axial · 0.34mm/px · z∈[+1416,+1476]mm · 2 of 90 slices shown]
[im 30/90  bone]
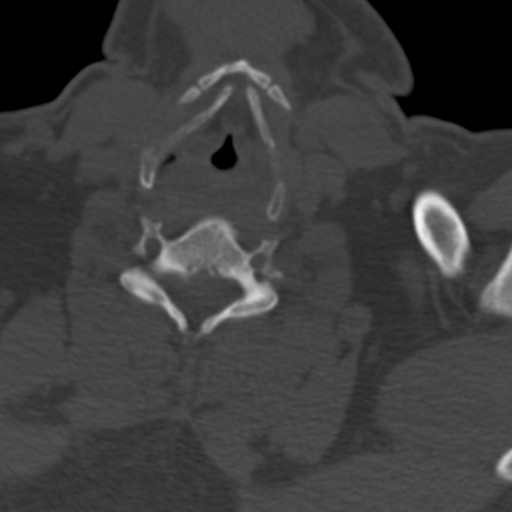
[im 60/90  bone]
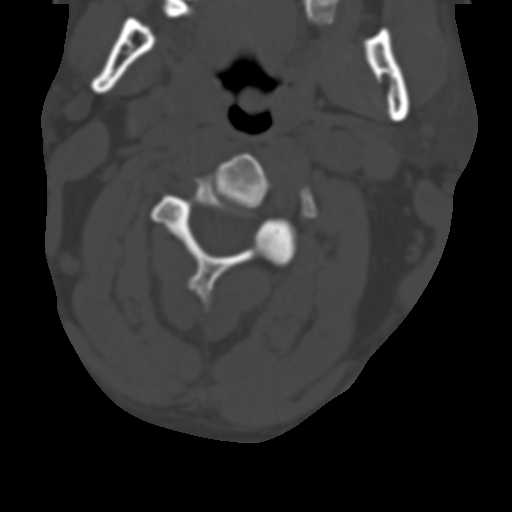

[Series 8: axial recon · axial · 0.23mm/px · z∈[+1392,+1455]mm · 2 of 96 slices shown, 3 images]
[im 32/96  soft-tissue]
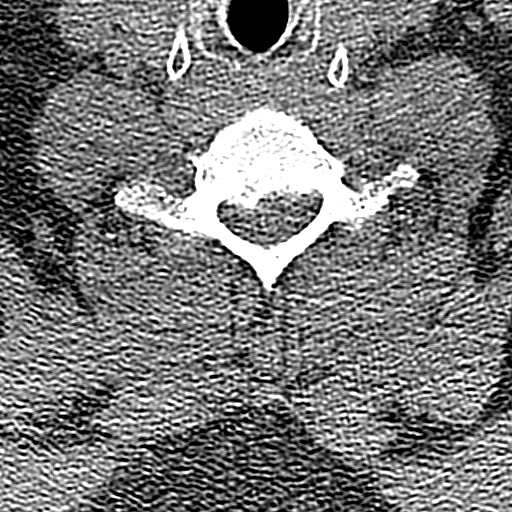
[im 32/96  bone]
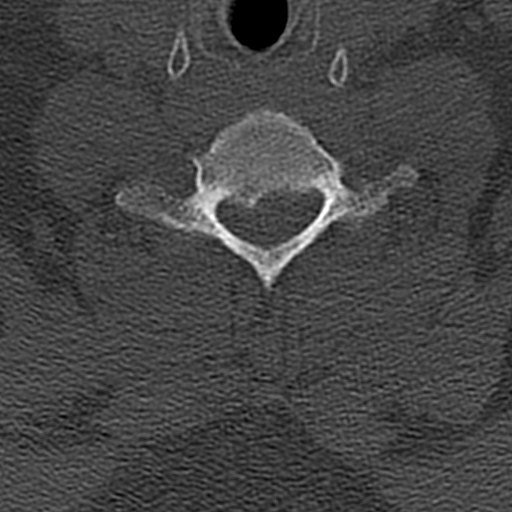
[im 64/96  bone]
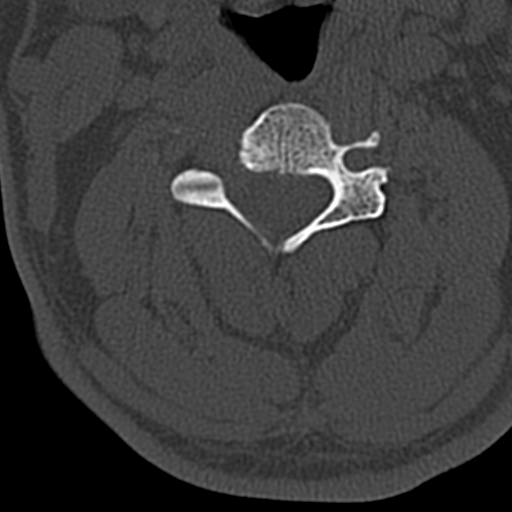

[Series 10: sagittal · sagittal · 0.33mm/px · 1 of 69 slices shown]
[im 35/69  bone]
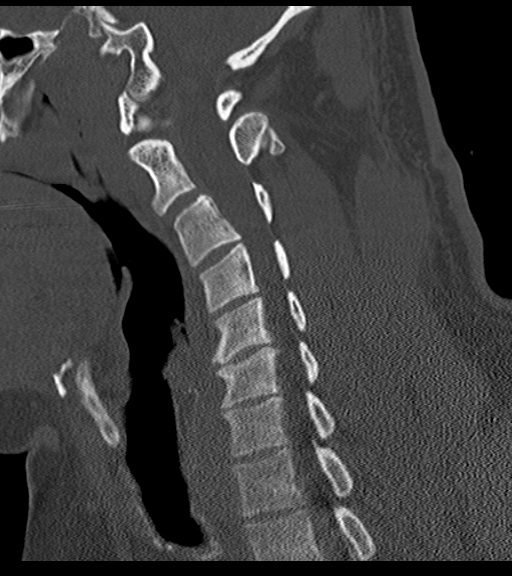

[5 of 16 positions shown; findings below may reference images not displayed]

FINDINGS: CT HEAD FINDINGS

Mild atrophy.  Negative for hydrocephalus.

Negative for acute infarct.  Negative for hemorrhage or mass lesion.

Air-fluid level right maxillary sinus.  Negative for skull fracture.

CT MAXILLOFACIAL FINDINGS

Mucosal edema in the paranasal sinuses. Air-fluid level in the
maxillary sinus bilaterally.

Negative for orbital fracture. No fracture the maxilla. Mildly
depressed nasal bone fracture of unknown age. Nasal septum deviated
to the left without definite acute fracture of the septum. Left
molar dental infection.

CT CERVICAL SPINE FINDINGS

Dextroscoliosis. Normal alignment with accentuated cervical
kyphosis.

Negative for cervical spine fracture.

Mild disc degeneration and mild spurring C5-6 and C6-7.
IMPRESSION: No acute intracranial abnormality.

Mildly depressed fracture of the nasal bone of indeterminate age.

Mucosal edema and air-fluid levels in the maxillary sinus
bilaterally. No other facial fracture identified

Cervical spine degenerative change with kyphosis and scoliosis.
Negative for cervical spine fracture.
